# Patient Record
Sex: Male | Born: 1975 | Race: White | Hispanic: No | Marital: Married | Smoking: Current every day smoker
Health system: Southern US, Community
[De-identification: ages and names within clinical notes are randomized; demographics above are authoritative.]

## PROBLEM LIST (undated history)

## (undated) DIAGNOSIS — R29810 Facial weakness: Secondary | ICD-10-CM

## (undated) DIAGNOSIS — Z8619 Personal history of other infectious and parasitic diseases: Secondary | ICD-10-CM

## (undated) HISTORY — PX: WISDOM TOOTH EXTRACTION: SHX21

## (undated) HISTORY — DX: Personal history of other infectious and parasitic diseases: Z86.19

## (undated) HISTORY — DX: Facial weakness: R29.810

---

## 2016-05-03 DIAGNOSIS — D225 Melanocytic nevi of trunk: Secondary | ICD-10-CM | POA: Diagnosis not present

## 2016-05-03 DIAGNOSIS — B078 Other viral warts: Secondary | ICD-10-CM | POA: Diagnosis not present

## 2016-11-18 ENCOUNTER — Encounter: Payer: Self-pay | Admitting: Internal Medicine

## 2016-11-18 ENCOUNTER — Ambulatory Visit (INDEPENDENT_AMBULATORY_CARE_PROVIDER_SITE_OTHER): Payer: BLUE CROSS/BLUE SHIELD | Admitting: Internal Medicine

## 2016-11-18 VITALS — BP 144/84 | HR 68 | Temp 98.4°F | Ht 69.33 in | Wt 170.5 lb

## 2016-11-18 DIAGNOSIS — N50812 Left testicular pain: Secondary | ICD-10-CM

## 2016-11-18 DIAGNOSIS — R202 Paresthesia of skin: Secondary | ICD-10-CM

## 2016-11-18 DIAGNOSIS — R030 Elevated blood-pressure reading, without diagnosis of hypertension: Secondary | ICD-10-CM | POA: Diagnosis not present

## 2016-11-18 NOTE — Progress Notes (Signed)
HPI  Pt presents to the clinic today to establish care and for management of the conditions listed below. He has not had a PCP in many years.  HTN: His BP today is 144/84. He reports he has never been told that he has HTN. He denies headaches, dizziness or visual changes. He denies chest pain or shortness of breath.   He also reports decreased sensation in his left lateral thigh. He noticed this 1 month ago. He reports it feels almost like a tingling sensation. He denies numbness. He denies any pain in his back. He denies loss of bowel or bladder. He has not taken anything OTC for this.  He also c/o left testicular pain. This started 1 month ago. It is intermittent but seems worse when he runs. He denies redness or swelling. He denies urgency, frequency or dysuria. No certain position makes it worse. He has not tried anything OTC for this.  Flu: never Tetanus: 2015 Vision Screening: as needed Dentist: as needed  Past Medical History:  Diagnosis Date  . History of chicken pox     No current outpatient prescriptions on file.   No current facility-administered medications for this visit.     No Known Allergies  Family History  Problem Relation Age of Onset  . Stroke Father     Social History   Social History  . Marital status: Married    Spouse name: N/A  . Number of children: N/A  . Years of education: N/A   Occupational History  . Not on file.   Social History Main Topics  . Smoking status: Current Every Day Smoker    Packs/day: 1.50    Types: Cigarettes  . Smokeless tobacco: Never Used  . Alcohol use 1.2 oz/week    2 Cans of beer per week     Comment: moderate  . Drug use: No  . Sexual activity: Not on file   Other Topics Concern  . Not on file   Social History Narrative  . No narrative on file    ROS:  Constitutional: Denies fever, malaise, fatigue, headache or abrupt weight changes.  HEENT: Denies eye pain, eye redness, ear pain, ringing in the ears,  wax buildup, runny nose, nasal congestion, bloody nose, or sore throat. Respiratory: Denies difficulty breathing, shortness of breath, cough or sputum production.   Cardiovascular: Denies chest pain, chest tightness, palpitations or swelling in the hands or feet.  Gastrointestinal: Denies abdominal pain, bloating, constipation, diarrhea or blood in the stool.  GU: Pt reports testicular pain. Denies frequency, urgency, pain with urination, blood in urine, odor or discharge. Musculoskeletal: Denies decrease in range of motion, difficulty with gait, muscle pain or joint pain and swelling.  Skin: Denies redness, rashes, lesions or ulcercations.  Neurological: Pt reports tingling of his left thigh. Denies dizziness, difficulty with memory, difficulty with speech or problems with balance and coordination.  Psych: Denies anxiety, depression, SI/HI.  No other specific complaints in a complete review of systems (except as listed in HPI above).  PE:  BP (!) 144/84   Pulse 68   Temp 98.4 F (36.9 C) (Oral)   Ht 5' 9.33" (1.761 m)   Wt 170 lb 8 oz (77.3 kg)   SpO2 97%   BMI 24.94 kg/m  Wt Readings from Last 3 Encounters:  11/18/16 170 lb 8 oz (77.3 kg)    General: Appears his stated age, well developed, well nourished in NAD. Skin: Dry and intact. Cardiovascular: Normal rate and rhythm. S1,S2  noted.  No murmur, rubs or gallops noted.  Pulmonary/Chest: Normal effort and positive vesicular breath sounds. No respiratory distress. No wheezes, rales or ronchi noted.  Abdomen: Soft and nontender. Normal bowel sounds. No distention or masses noted.  Genitalia: Normal male anatomy. No pain with palpation of the left testicle. No mass noted. No redness or swelling noted. Positive cremasteric reflex. Neurological: Alert and oriented. Sensation intact to BLE. Psychiatric: He is anxious appearing today.    Assessment and Plan:  Elevated Blood Pressure:  Will have him return to the clinic in 2 weeks  to repeat blood pressure If remains elevated, will discuss antihypertensive medications  Left Testicular Pain:  No abnormality noted Advised him to try to wear briefs instead of boxers, especially while running If persist, will get urinalysis and ultrasound  Paresthesia, Left Thigh:  No concern for sciatica at this time Offered RX for Pred Taper, he declines Will monitor, he will let me know if this persist or worsens  RTC in 2 weeks for follow up of HTN Henya Aguallo, NP

## 2016-11-21 ENCOUNTER — Telehealth: Payer: Self-pay | Admitting: *Deleted

## 2016-11-21 DIAGNOSIS — N50812 Left testicular pain: Secondary | ICD-10-CM

## 2016-11-21 NOTE — Addendum Note (Signed)
Addended by: Lorre MunroeBAITY, REGINA W on: 11/21/2016 01:10 PM   Modules accepted: Orders

## 2016-11-21 NOTE — Patient Instructions (Signed)
Paresthesia  Paresthesia is a burning or prickling feeling. This feeling can happen in any part of the body. It often happens in the hands, arms, legs, or feet. Usually, it is not painful. In most cases, the feeling goes away in a short time and is not a sign of a serious problem.  Follow these instructions at home:  · Avoid drinking alcohol.  · Try massage or needle therapy (acupuncture) to help with your problems.  · Keep all follow-up visits as told by your doctor. This is important.  Contact a doctor if:  · You keep on having episodes of paresthesia.  · Your burning or prickling feeling gets worse when you walk.  · You have pain or cramps.  · You feel dizzy.  · You have a rash.  Get help right away if:  · You feel weak.  · You have trouble walking or moving.  · You have problems speaking, understanding, or seeing.  · You feel confused.  · You cannot control when you pee (urinate) or poop (bowel movement).  · You lose feeling (numbness) after an injury.  · You pass out (faint).  This information is not intended to replace advice given to you by your health care provider. Make sure you discuss any questions you have with your health care provider.  Document Released: 06/16/2008 Document Revised: 12/10/2015 Document Reviewed: 06/30/2014  Elsevier Interactive Patient Education © 2017 [employer] Inc[employer]

## 2016-11-21 NOTE — Telephone Encounter (Signed)
Pt would like the referral now and not [place]

## 2016-11-21 NOTE — Telephone Encounter (Signed)
We can do and order for urologist or an order for an ultrasound. Does he have a preference on which he would like?

## 2016-11-21 NOTE — Telephone Encounter (Signed)
Referral placed.

## 2016-11-21 NOTE — Telephone Encounter (Signed)
Spoke to pt who was seen on 5/4 to establish care. He states that he discussed his testicle pain with regina, but reports it has increased over the weekend. Pt was advised to f/u in 2wks, but is wanting to know if he can have a referral to a urologist sooner. pls advise

## 2016-11-22 ENCOUNTER — Emergency Department (HOSPITAL_COMMUNITY)
Admission: EM | Admit: 2016-11-22 | Discharge: 2016-11-22 | Disposition: A | Discharge status: Home / Self Care | Payer: BLUE CROSS/BLUE SHIELD | Attending: Emergency Medicine | Admitting: Emergency Medicine

## 2016-11-22 DIAGNOSIS — N50819 Testicular pain, unspecified: Secondary | ICD-10-CM | POA: Diagnosis not present

## 2016-11-22 DIAGNOSIS — F17210 Nicotine dependence, cigarettes, uncomplicated: Secondary | ICD-10-CM | POA: Insufficient documentation

## 2016-11-22 DIAGNOSIS — N50811 Right testicular pain: Secondary | ICD-10-CM | POA: Diagnosis not present

## 2016-11-22 DIAGNOSIS — N50812 Left testicular pain: Secondary | ICD-10-CM | POA: Diagnosis not present

## 2016-11-22 LAB — URINALYSIS, ROUTINE W REFLEX MICROSCOPIC
BILIRUBIN URINE: NEGATIVE
GLUCOSE, UA: NEGATIVE mg/dL
HGB URINE DIPSTICK: NEGATIVE
KETONES UR: NEGATIVE mg/dL
LEUKOCYTES UA: NEGATIVE
Nitrite: NEGATIVE
PROTEIN: NEGATIVE mg/dL
Specific Gravity, Urine: 1.006 (ref 1.005–1.030)
pH: 6 (ref 5.0–8.0)

## 2016-11-22 NOTE — ED Notes (Signed)
Pt made aware urine sample is needed.  

## 2016-11-22 NOTE — ED Provider Notes (Signed)
[facility]-EMERGENCY DEPT Provider Note   CSN: [id number] Arrival date & time: 11/22/16  2027     History   Chief Complaint Chief Complaint  Patient presents with  . Testicle Pain    HPI Gerald Page is a 41 y.o. male.  The history is provided by the patient.  Testicle Pain  This is a new problem. Episode onset: 2 months. The problem occurs constantly. Progression since onset: fluctuating. Pertinent negatives include no chest pain, no abdominal pain, no headaches and no shortness of breath. Nothing aggravates the symptoms. Nothing relieves the symptoms. He has tried nothing for the symptoms.   Patient reports that the pain began while on his honeymoon after having sexual intercourse with his wife. He reports that she might of pulled his testicles. Pain began the following day.  Past Medical History:  Diagnosis Date  . History of chicken pox     There are no active problems to display for this patient.   No past surgical history on file.     Home Medications    Prior to Admission medications   Not on File    Family History Family History  Problem Relation Age of Onset  . Stroke Father     Social History Social History  Substance Use Topics  . Smoking status: Current Every Day Smoker    Packs/day: 1.50    Years: 30.00    Types: Cigarettes  . Smokeless tobacco: Never Used  . Alcohol use 2.4 - 3.6 oz/week    4 - 6 Cans of beer per week     Comment: moderate     Allergies   Patient has no known allergies.   Review of Systems Review of Systems  Respiratory: Negative for shortness of breath.   Cardiovascular: Negative for chest pain.  Gastrointestinal: Negative for abdominal pain.  Genitourinary: Positive for testicular pain. Negative for discharge, flank pain, genital sores, hematuria, penile pain, penile swelling and scrotal swelling.  Neurological: Negative for headaches.     Physical Exam Updated Vital Signs BP (!) 134/99 (BP Location: Left Arm)    Pulse 64   Temp 98.6 F (37 C) (Oral)   Resp 16   SpO2 100%  ** Physical Exam  Constitutional: He is oriented to person, place, and time. He appears well-developed and well-nourished. No distress.  HENT:  Head: Normocephalic and atraumatic.  Right Ear: External ear normal.  Left Ear: External ear normal.  Nose: Nose normal.  Mouth/Throat: Mucous membranes are normal. No trismus in the jaw.  Eyes: Conjunctivae and EOM are normal. No scleral icterus.  Neck: Normal range of motion and phonation normal.  Cardiovascular: Normal rate and regular rhythm.   Pulmonary/Chest: Effort normal. No stridor. No respiratory distress.  Abdominal: He exhibits no distension. Hernia confirmed negative in the right inguinal area and confirmed negative in the left inguinal area.  Genitourinary: Testes normal and penis normal. Cremasteric reflex is present. Right testis shows no mass, no swelling and no tenderness. Right testis is descended. Left testis shows no mass, no swelling and no tenderness. Left testis is descended. Circumcised. No penile tenderness. No discharge found.  Musculoskeletal: Normal range of motion. He exhibits no edema.  Lymphadenopathy: No inguinal adenopathy noted on the right or left side.  Neurological: He is alert and oriented to person, place, and time.  Skin: He is not diaphoretic.  Psychiatric: He has a normal mood and affect. His behavior is normal.  Vitals reviewed.    ED Treatments / Results  Labs (all labs ordered are listed, but only abnormal results are displayed) Labs Reviewed  URINALYSIS, ROUTINE W REFLEX MICROSCOPIC - Abnormal; Notable for the following:       Result Value   Color, Urine STRAW (*)    All other components within normal limits    EKG  EKG Interpretation None       Radiology No results found.  Procedures Procedures (including critical care time)  Medications Ordered in ED Medications - No data to display   Initial Impression /  Assessment and Plan / ED Course  I have reviewed the triage vital signs and the nursing notes.  Pertinent labs & imaging results that were available during my care of the patient were reviewed by me and considered in my medical decision making (see chart for details).     Presentation consistent with testicular torsion. No evidence of cellulitis. UA without evidence of infection. Low suspicion for epididymitis/orchitis.  Symptoms started following sexual encounter with wife. Possible soft tissue injury.   No indication for emergent ultrasound at this time. We'll have patient follow-up with urology.  Final Clinical Impressions(s) / ED Diagnoses   Final diagnoses:  Testicle pain   Disposition: Discharge  Condition: Good  I have discussed the results, Dx and Tx plan with the patient who expressed understanding and agree(s) with the plan. Discharge instructions discussed at great length. The patient was given strict return precautions who verbalized understanding of the instructions. No further questions at time of discharge.    New Prescriptions   No medications on file    Follow Up: Sebastian Ache, MD [address] [place] [address] [phone]  Schedule an appointment as soon as possible for a visit    Lorre Munroe, NP [address] [place] [address] [phone]  Schedule an appointment as soon as possible for a visit        Nira Conn, MD 11/22/16 2209

## 2016-11-22 NOTE — ED Notes (Signed)
Pt c/o testicle pain for over 1 month. Pt has had pain since being on honeymoon and it hasn't been right since. Pt has had to place ice packs on testicle to relieve pain. Denies any urinary symptoms.

## 2016-11-23 ENCOUNTER — Telehealth: Payer: Self-pay | Admitting: Internal Medicine

## 2016-11-23 ENCOUNTER — Other Ambulatory Visit: Payer: Self-pay | Admitting: Internal Medicine

## 2016-11-23 ENCOUNTER — Ambulatory Visit
Admission: RE | Admit: 2016-11-23 | Discharge: 2016-11-23 | Disposition: A | Discharge status: Home / Self Care | Payer: BLUE CROSS/BLUE SHIELD | Source: Ambulatory Visit | Attending: Internal Medicine | Admitting: Internal Medicine

## 2016-11-23 DIAGNOSIS — N50812 Left testicular pain: Secondary | ICD-10-CM

## 2016-11-23 DIAGNOSIS — N50819 Testicular pain, unspecified: Secondary | ICD-10-CM

## 2016-11-23 DIAGNOSIS — N4342 Spermatocele of epididymis, multiple: Secondary | ICD-10-CM | POA: Diagnosis not present

## 2016-11-23 NOTE — Telephone Encounter (Signed)
Pt returned missed call 

## 2016-11-23 NOTE — Addendum Note (Signed)
Addended by: Lorre MunroeBAITY, Jaryn Hocutt W on: 11/23/2016 11:24 AM   Modules accepted: Orders

## 2016-11-23 NOTE — Telephone Encounter (Signed)
Ultrasound ordered

## 2016-11-23 NOTE — Telephone Encounter (Signed)
Patient spoke to [facility] and they told him he needed to call us and request that you order a Testicular US and Doppler. Please choose the [facility] in [place] as the location. They will not see him if he doesn't have the US first.Please order and include the doppler as well.

## 2016-11-24 ENCOUNTER — Ambulatory Visit: Payer: Self-pay

## 2016-11-25 ENCOUNTER — Ambulatory Visit: Payer: BLUE CROSS/BLUE SHIELD | Admitting: Urology

## 2016-11-25 ENCOUNTER — Encounter: Payer: Self-pay | Admitting: Urology

## 2016-11-25 VITALS — BP 135/79 | HR 76 | Ht 69.0 in | Wt 171.2 lb

## 2016-11-25 DIAGNOSIS — N50819 Testicular pain, unspecified: Secondary | ICD-10-CM | POA: Diagnosis not present

## 2016-11-25 NOTE — Progress Notes (Signed)
11/25/2016 2:03 PM   Gerald Page January 12, 1976 [id number]  Referring provider: Lorre MunroeBaity, Regina W, NP [address] [place], [place] [id number]  Chief Complaint  Patient presents with  . Testicle Pain    HPI: The patient is a 41 year old gentleman who presents today for evaluation of left testicular pain.  This is been gone on since his honeymoon which started on 10/15/2016. He has occasional sharp pain in his left testicle. He does not appear to radiate from anywhere. It is intermittent. Sometimes it causes him to double over in pain. He feels that heavy yardwork and jogging make it worse. Icing it makes it better. He has never had this problem before. He was seen in the emergency department 3 days ago where urinalysis and scrotal ultrasound were unremarkable for source of etiology.  He has no history of genitourinary surgery.  PMH: Past Medical History:  Diagnosis Date  . History of chicken pox     Surgical History: Past Surgical History:  Procedure Laterality Date  . WISDOM TOOTH EXTRACTION      Home Medications:  Allergies as of 11/25/2016   No Known Allergies     Medication List    as of 11/25/2016  2:03 PM   You have not been prescribed any medications.     Allergies: No Known Allergies  Family History: Family History  Problem Relation Age of Onset  . Stroke Father   . Prostate cancer Neg Hx   . Bladder Cancer Neg Hx   . Kidney cancer Neg Hx     Social History:  reports that he has been smoking Cigarettes.  He has a 45.00 pack-year smoking history. He has never used smokeless tobacco. He reports that he drinks about 2.4 - 3.6 oz of alcohol per week . He reports that he does not use drugs.  ROS: UROLOGY Frequent Urination?: No Hard to postpone urination?: No Burning/pain with urination?: No Get up at night to urinate?: No Leakage of urine?: No Urine stream starts and stops?: No Trouble starting stream?: No Do you have to strain to urinate?:  No Blood in urine?: No Urinary tract infection?: No Sexually transmitted disease?: No Injury to kidneys or bladder?: No Painful intercourse?: No Weak stream?: No Erection problems?: No Penile pain?: No  Gastrointestinal Nausea?: No Vomiting?: No Indigestion/heartburn?: No Diarrhea?: No Constipation?: No  Constitutional Fever: No Night sweats?: No Weight loss?: No Fatigue?: No  Skin Skin rash/lesions?: No Itching?: No  Eyes Blurred vision?: No Double vision?: No  Ears/Nose/Throat Sore throat?: No Sinus problems?: No  Hematologic/Lymphatic Swollen glands?: No Easy bruising?: No  Cardiovascular Leg swelling?: No Chest pain?: No  Respiratory Cough?: No Shortness of breath?: No  Endocrine Excessive thirst?: No  Musculoskeletal Back pain?: No Joint pain?: No  Neurological Headaches?: No Dizziness?: No  Psychologic Depression?: No Anxiety?: No  Physical Exam: BP 135/79 (BP Location: Left Arm, Patient Position: Sitting, Cuff Size: Normal)   Pulse 76   Ht 5\' 9"  (1.753 m)   Wt 171 lb 3.2 oz (77.7 kg)   BMI 25.28 kg/m   Constitutional:  Alert and oriented, No acute distress. HEENT:  AT, moist mucus membranes.  Trachea midline, no masses. Cardiovascular: No clubbing, cyanosis, or edema. Respiratory: Normal respiratory effort, no increased work of breathing. GI: Abdomen is soft, nontender, nondistended, no abdominal masses GU: No CVA tenderness. Normal phallus. Testicles descended bilaterally. No masses. Nontender to palpation. Skin: No rashes, bruises or suspicious lesions. Lymph: No cervical or inguinal adenopathy. Neurologic:  Grossly intact, no focal deficits, moving all 4 extremities. Psychiatric: Normal mood and affect.  Laboratory Data: No results found for: WBC, HGB, HCT, MCV, PLT  No results found for: CREATININE  No results found for: PSA  No results found for: TESTOSTERONE  No results found for: HGBA1C  Urinalysis     Component Value Date/Time   COLORURINE STRAW (A) 11/22/2016 2144   APPEARANCEUR CLEAR 11/22/2016 2144   LABSPEC 1.006 11/22/2016 2144   PHURINE 6.0 11/22/2016 2144   GLUCOSEU NEGATIVE 11/22/2016 2144   HGBUR NEGATIVE 11/22/2016 2144   BILIRUBINUR NEGATIVE 11/22/2016 2144   KETONESUR NEGATIVE 11/22/2016 2144   PROTEINUR NEGATIVE 11/22/2016 2144   NITRITE NEGATIVE 11/22/2016 2144   LEUKOCYTESUR NEGATIVE 11/22/2016 2144    Pertinent Imaging: Scrotal u/s reviewed above. Unremarkable.  Assessment & Plan:    1. Orchialgia I assured the patient that he has no signs or concerns for malignancy at this time. I did discuss this can be a difficult problem to treat as it is often muscular skeletal in nature. I have recommended for him to ice his scrotum twice per day. I've also recommended ibuprofen 600 mg 3 times a day on a scheduled basis. We'll try treating this conservatively. We did briefly discuss that his pain does not improve in one month, and he will return for follow-up visit. We will need to discuss physical therapy at that time.   Return in about 4 weeks (around 12/23/2016).  Hildred Laser, MD  [facility] [address] [place], [place] [address] [phone]

## 2016-12-02 ENCOUNTER — Ambulatory Visit: Payer: BLUE CROSS/BLUE SHIELD | Admitting: Internal Medicine

## 2016-12-21 ENCOUNTER — Telehealth: Payer: Self-pay | Admitting: Urology

## 2016-12-21 NOTE — Telephone Encounter (Signed)
Left pt mess to call/SW 

## 2016-12-21 NOTE — Telephone Encounter (Signed)
Pt called and stated Budzyn told him if the pain subsides, to cancel appt for tomorrow.  He's not sure if he should keep it or not.  Can you please give pt a call.  He says other doctors can't find anything wrong and don't understand why he's having pain.  He is still having pain.  [phone]

## 2016-12-22 ENCOUNTER — Ambulatory Visit: Payer: BLUE CROSS/BLUE SHIELD | Admitting: Urology

## 2016-12-22 NOTE — Telephone Encounter (Signed)
Pt returned call. Pt states pain has not changed since last appointment. It generally happens in the morning but not every day. Pt asks if he needs to keep appt. Per Dr Sherryl BartersBudzyn pt was advised that a referral can be made for pelvic floor therapy which may help as this appears to be musculoskeletal in nature. Pt states he would like to cancel appointment for today & will call back if pain gets worse or doesn't improve to get PT referral.

## 2017-02-05 ENCOUNTER — Emergency Department (HOSPITAL_COMMUNITY)
Admission: EM | Admit: 2017-02-05 | Discharge: 2017-02-05 | Disposition: A | Discharge status: Home / Self Care | Payer: BLUE CROSS/BLUE SHIELD | Attending: Emergency Medicine | Admitting: Emergency Medicine

## 2017-02-05 ENCOUNTER — Encounter (HOSPITAL_COMMUNITY): Payer: Self-pay | Admitting: Emergency Medicine

## 2017-02-05 ENCOUNTER — Emergency Department (HOSPITAL_COMMUNITY): Payer: BLUE CROSS/BLUE SHIELD

## 2017-02-05 DIAGNOSIS — R200 Anesthesia of skin: Secondary | ICD-10-CM | POA: Diagnosis not present

## 2017-02-05 DIAGNOSIS — G510 Bell's palsy: Secondary | ICD-10-CM | POA: Diagnosis not present

## 2017-02-05 DIAGNOSIS — R29810 Facial weakness: Secondary | ICD-10-CM | POA: Diagnosis not present

## 2017-02-05 DIAGNOSIS — F17210 Nicotine dependence, cigarettes, uncomplicated: Secondary | ICD-10-CM | POA: Insufficient documentation

## 2017-02-05 DIAGNOSIS — M6281 Muscle weakness (generalized): Secondary | ICD-10-CM | POA: Diagnosis not present

## 2017-02-05 LAB — BASIC METABOLIC PANEL
ANION GAP: 9 (ref 5–15)
BUN: 15 mg/dL (ref 6–20)
CALCIUM: 8.7 mg/dL — AB (ref 8.9–10.3)
CHLORIDE: 106 mmol/L (ref 101–111)
CO2: 25 mmol/L (ref 22–32)
CREATININE: 1.03 mg/dL (ref 0.61–1.24)
GFR calc non Af Amer: >60 mL/min (ref >60)
Glucose, Bld: 140 mg/dL — ABNORMAL HIGH (ref 65–99)
Potassium: 3.8 mmol/L (ref 3.5–5.1)
SODIUM: 140 mmol/L (ref 135–145)

## 2017-02-05 LAB — CBC WITH DIFFERENTIAL/PLATELET
BASOS ABS: 0 10*3/uL (ref 0.0–0.1)
BASOS PCT: 0 %
EOS ABS: 0.1 10*3/uL (ref 0.0–0.7)
Eosinophils Relative: 1 %
HEMATOCRIT: 44.9 % (ref 39.0–52.0)
HEMOGLOBIN: 16.1 g/dL (ref 13.0–17.0)
Lymphocytes Relative: 12 %
Lymphs Abs: 0.9 10*3/uL (ref 0.7–4.0)
MCH: 32.5 pg (ref 26.0–34.0)
MCHC: 35.9 g/dL (ref 30.0–36.0)
MCV: 90.7 fL (ref 78.0–100.0)
Monocytes Absolute: 0.6 10*3/uL (ref 0.1–1.0)
Monocytes Relative: 7 %
NEUTROS ABS: 6.1 10*3/uL (ref 1.7–7.7)
NEUTROS PCT: 80 %
Platelets: 214 10*3/uL (ref 150–400)
RBC: 4.95 MIL/uL (ref 4.22–5.81)
RDW: 12 % (ref 11.5–15.5)
WBC: 7.7 10*3/uL (ref 4.0–10.5)

## 2017-02-05 MED ORDER — PREDNISONE 10 MG (21) PO TBPK: ORAL_TABLET | Freq: Every day | ORAL | 0 refills | Status: AC

## 2017-02-05 MED ORDER — ACYCLOVIR 400 MG PO TABS: 400.0000 mg | ORAL_TABLET | Freq: Every day | ORAL | 0 refills | Status: AC

## 2017-02-05 NOTE — ED Triage Notes (Signed)
Pt woke up with R side facial numbness and droop this am. No extremity weakness or numbness. No vision changes. No HA.

## 2017-02-05 NOTE — ED Provider Notes (Signed)
Pt transferred for MRI. Pt has R sided facial numbness, weakness. Weakness is central sparing./ Pt has hx of heavy smoking (2 packs /day sometimes) and heavy drinking.  MRI ordered, and neg.  We will tx as Bells, but recommend a f.u with Neurologist as the symptoms are not 100% similar to Bells. Pt denies any family hx of MS.    Derwood KaplanNanavati, Alp [place], MD 02/05/17 [phone]

## 2017-02-05 NOTE — ED Notes (Signed)
Pt denies any previous issues with claustrophobia, states he does not feel that he needs any medication prior to going to MRI.

## 2017-02-05 NOTE — Discharge Instructions (Signed)
MRI shows no signs of stroke. We ask you to take the medicine prescribed, which would cover you for Bell's Palsy. However, the symptoms are not 100% consistent with Bell's so we still would like you to see the neurologist.

## 2017-02-05 NOTE — ED Notes (Signed)
ED Provider at bedside. 

## 2017-02-05 NOTE — ED Notes (Signed)
Pt arrives via [facility] from [facility] for c/o right sided facial numbness and droop that patient noticed when he woke up around 11am this morning. Pt a/ox4, speech clear, no other neuro deficits noted.

## 2017-02-05 NOTE — ED Notes (Signed)
Patient transported to CT 

## 2017-02-05 NOTE — ED Notes (Signed)
Report given to [facility] staff. 

## 2017-02-05 NOTE — ED Notes (Signed)
Patient transported to MRI 

## 2017-02-05 NOTE — ED Notes (Signed)
[facility] RN notified that pt will be transported to ED for MRI

## 2017-02-05 NOTE — ED Provider Notes (Addendum)
[facility]-EMERGENCY DEPT Provider Note   CSN: [id number] Arrival date & time: 02/05/17  1148     History   Chief Complaint Chief Complaint  Patient presents with  . facial numbness    HPI Gerald Page is a 41 y.o. male. CC: right facial "weakness"  HPI: 41 year old male who complains of right facial "weakness". He states that it just felt different tonight he could not place it. It felt normal to touch with his hand. He noticed in the mirror that his right lower face looked weaker. He is no headache or neck pain. He is a smoker. He states at times is been told his blood pressure is high at Dr. dental visits but is not on medication. No diabetes. No history of strokes. No history of A. fib or palpitations. No upper lower extremity signs or symptoms or weakness or numbness.  Past Medical History:  Diagnosis Date  . History of chicken pox     There are no active problems to display for this patient.   Past Surgical History:  Procedure Laterality Date  . WISDOM TOOTH EXTRACTION         Home Medications    Prior to Admission medications   Not on File    Family History Family History  Problem Relation Age of Onset  . Stroke Father   . Prostate cancer Neg Hx   . Bladder Cancer Neg Hx   . Kidney cancer Neg Hx     Social History Social History  Substance Use Topics  . Smoking status: Current Every Day Smoker    Packs/day: 1.50    Years: 30.00    Types: Cigarettes  . Smokeless tobacco: Never Used  . Alcohol use 2.4 - 3.6 oz/week    4 - 6 Cans of beer per week     Comment: moderate     Allergies   Patient has no known allergies.   Review of Systems Review of Systems  Constitutional: Negative for appetite change, chills, diaphoresis, fatigue and fever.  HENT: Negative for mouth sores, sore throat and trouble swallowing.   Eyes: Negative for visual disturbance.  Respiratory: Negative for cough, chest tightness, shortness of breath and wheezing.     Cardiovascular: Negative for chest pain.  Gastrointestinal: Negative for abdominal distention, abdominal pain, diarrhea, nausea and vomiting.  Endocrine: Negative for polydipsia, polyphagia and polyuria.  Genitourinary: Negative for dysuria, frequency and hematuria.  Musculoskeletal: Negative for gait problem.  Skin: Negative for color change, pallor and rash.  Neurological: Negative for dizziness, syncope, light-headedness and headaches.       Right lower facial weakness  Hematological: Does not bruise/bleed easily.  Psychiatric/Behavioral: Negative for behavioral problems and confusion.     Physical Exam Updated Vital Signs BP 128/83   Pulse 80   Resp 16   SpO2 98%   Physical Exam  Constitutional: He is oriented to person, place, and time. He appears well-developed and well-nourished. No distress.  HENT:  Head: Normocephalic.  Eyes: Pupils are equal, round, and reactive to light. Conjunctivae are normal. No scleral icterus.  Neck: Normal range of motion. Neck supple. No thyromegaly present.  Cardiovascular: Normal rate and regular rhythm.  Exam reveals no gallop and no friction rub.   No murmur heard. Pulmonary/Chest: Effort normal and breath sounds normal. No respiratory distress. He has no wheezes. He has no rales.  Abdominal: Soft. Bowel sounds are normal. He exhibits no distension. There is no tenderness. There is no rebound.  Musculoskeletal: Normal range of  motion.  Neurological: He is alert and oriented to person, place, and time.  Subtle decreased movement of the right lower face versus left. He has no Bell's phenomenon. He has normal movement of the upper face bilaterally. Normal extra ocular movements. Pupils symmetric reactive. Normal palate. Normal shoulder shrug. Patient given a sip of cola. He states it tastes the same on his anterior tongue  Skin: Skin is warm and dry. No rash noted.  Psychiatric: He has a normal mood and affect. His behavior is normal.     ED  Treatments / Results  Labs (all labs ordered are listed, but only abnormal results are displayed) Labs Reviewed  CBC WITH DIFFERENTIAL/PLATELET  BASIC METABOLIC PANEL    EKG  EKG Interpretation None       Radiology No results found.  Procedures Procedures (including critical care time)  Medications Ordered in ED Medications - No data to display   Initial Impression / Assessment and Plan / ED Course  I have reviewed the triage vital signs and the nursing notes.  Pertinent labs & imaging results that were available during my care of the patient were reviewed by me and considered in my medical decision making (see chart for details).   isolated right lower facial weakness without upper facial weakness to suggest peripheral nerve palsy. Patient's NIH is 1 for facial weakness area and not a TPA candidate. Symptoms present on awakening. Plan CT, and if  Normal, will require MRI.  Final Clinical Impressions(s) / ED Diagnoses   Final diagnoses:  Facial weakness    CT normal. On reexam I still cannot find any suggestion of upper facial weakness or Bell's phenomenon, hearing change, or tastes change. Transferred to [facility] for MRI. Normal treatment would involve prednisone and taper with neurologic follow-up for probable early Bell's palsy  New Prescriptions New Prescriptions   No medications on file     Rolland Porter, MD 02/05/17 1227    Rolland Porter, MD 02/05/17 [phone]

## 2017-02-08 ENCOUNTER — Encounter: Payer: Self-pay | Admitting: Neurology

## 2017-02-08 ENCOUNTER — Ambulatory Visit (INDEPENDENT_AMBULATORY_CARE_PROVIDER_SITE_OTHER): Payer: BLUE CROSS/BLUE SHIELD | Admitting: Neurology

## 2017-02-08 DIAGNOSIS — R29810 Facial weakness: Secondary | ICD-10-CM

## 2017-02-08 HISTORY — DX: Facial weakness: R29.810

## 2017-02-08 NOTE — Progress Notes (Signed)
Reason for visit: Right facial weakness  Referring physician: [place]  Baldo AshMichael Stegall is a 41 y.o. male  History of present illness:  Mr. Gerald Page is a 41 year old right-handed white male with a history of tobacco abuse. The patient went to the emergency room on 02/05/2017 with onset of right lower face weakness that he noted upon awakening. The patient underwent MRI of the brain that was unremarkable. The patient does not report any pain around the right ear or in the head, he denies numbness of the extremities or on the face. He has not had any change in speech or swallowing or difficulty closing the eye. He denies any drooling, he is able to chew and swallow well. He has not noted any swelling around the right jaw area. He denies any balance problems or difficulty controlling the bowels or the bladder. The patient was placed on prednisone and Valtrex, but he never took the medications. He has a long-standing history of some numbness in the anterolateral aspect of the left thigh. He is sent to this office for an evaluation.  Past Medical History:  Diagnosis Date  . History of chicken pox     Past Surgical History:  Procedure Laterality Date  . WISDOM TOOTH EXTRACTION     x2    Family History  Problem Relation Age of Onset  . Stroke Father   . Prostate cancer Neg Hx   . Bladder Cancer Neg Hx   . Kidney cancer Neg Hx     Social history:  reports that he has been smoking Cigarettes.  He has a 45.00 pack-year smoking history. He has never used smokeless tobacco. He reports that he drinks about 2.4 - 3.6 oz of alcohol per week . He reports that he does not use drugs.  Medications:  Prior to Admission medications   Medication Sig Start Date End Date Taking? Authorizing Provider  acyclovir (ZOVIRAX) 400 MG tablet Take 1 tablet (400 mg total) by mouth 5 (five) times daily. Patient not taking: Reported on 02/08/2017 02/05/17   Derwood KaplanNanavati, Ankit, MD  predniSONE (STERAPRED UNI-PAK 21 TAB)  10 MG (21) TBPK tablet Take by mouth daily. Take 6 tabs by mouth daily  for 2 days, then 5 tabs for 2 days, then 4 tabs for 2 days, then 3 tabs for 2 days, 2 tabs for 2 days, then 1 tab by mouth daily for 2 days Patient not taking: Reported on 02/08/2017 02/05/17   Derwood KaplanNanavati, Ankit, MD     No Known Allergies  ROS:  Out of a complete 14 system review of symptoms, the patient complains only of the following symptoms, and all other reviewed systems are negative.  Right facial weakness  Blood pressure (!) 146/102, pulse 79, height 5\' 9"  (1.753 m), weight 169 lb 8 oz (76.9 kg).  Physical Exam  General: The patient is alert and cooperative at the time of the examination.  Eyes: Pupils are equal, round, and reactive to light. Discs are flat bilaterally.  Ears: Tympanic membranes are clear bilaterally.  Throat: No masses or swelling is noted in the oropharyngeal area.  Neck: The neck is supple, no carotid bruits are noted. No masses or swelling are noted around the angle of the jaw on the right or around the parotid gland.  Respiratory: The respiratory examination is clear.  Cardiovascular: The cardiovascular examination reveals a regular rate and rhythm, no obvious murmurs or rubs are noted.  Skin: Extremities are without significant edema.  Neurologic Exam  Mental status: The patient is alert and oriented x 3 at the time of the examination. The patient has apparent normal recent and remote memory, with an apparently normal attention span and concentration ability.  Cranial nerves: Facial symmetry is not present. There is a mild depression of the right nasolabial fold, minimally asymmetric smile. There is good sensation of the face to pinprick and soft touch bilaterally. The strength of the facial muscles and the muscles to head turning and shoulder shrug are normal bilaterally. Speech is well enunciated, no aphasia or dysarthria is noted. Extraocular movements are full. Visual fields are  full. The tongue is midline, and the patient has symmetric elevation of the soft palate. No obvious hearing deficits are noted.  Motor: The motor testing reveals 5 over 5 strength of all 4 extremities. Good symmetric motor tone is noted throughout.  Sensory: Sensory testing is intact to pinprick, soft touch, vibration sensation, and position sense on all 4 extremities. No evidence of extinction is noted.  Coordination: Cerebellar testing reveals good finger-nose-finger and heel-to-shin bilaterally.  Gait and station: Gait is normal. Tandem gait is normal. Romberg is negative. No drift is seen.  Reflexes: Deep tendon reflexes are symmetric and normal bilaterally. Toes are downgoing bilaterally.    MRI brain 02/05/17:  IMPRESSION: Normal intracranial examination. Some mucosal inflammation of the paranasal sinuses. No cause of the presenting symptoms is Identified.  * MRI scan images were reviewed online. I agree with the written report.    Assessment/Plan:  1. Right facial weakness  The patient appears to have a lower facial weakness, no other deficits are noted. MRI of the brain did not show evidence of a small brain or brainstem stroke. The patient does have risk factors for stroke, it is possible that a small stroke could have been missed. The patient could also have a very mild form of Bell's palsy. We will check blood work today, he will hold off on taking the prednisone and Valtrex, he will call our office if any progression of weakness is noted.   Marlan Palau. Keith Willis MD 02/08/2017 11:28 AM  [facility] [address] [place], [place] [id number]  Phone [phone] Fax [phone]

## 2017-02-10 LAB — B. BURGDORFI ANTIBODIES: Lyme IgG/IgM Ab: <0.91 {ISR} (ref 0.00–0.90)

## 2017-02-10 LAB — ANGIOTENSIN CONVERTING ENZYME: Angio Convert Enzyme: 46 U/L (ref 14–82)

## 2017-02-10 LAB — SEDIMENTATION RATE: Sed Rate: 2 mm/hr (ref 0–15)

## 2017-02-10 LAB — ANA W/REFLEX: ANA: NEGATIVE

## 2017-02-13 ENCOUNTER — Telehealth: Payer: Self-pay | Admitting: *Deleted

## 2017-02-13 NOTE — Telephone Encounter (Signed)
-----   Message from York Spanielharles K Willis, MD sent at 02/11/2017  8:12 AM EDT -----   The blood work results are unremarkable. Please call the patient.  ----- Message ----- From: Nell RangeInterface, [facility] Lab Results In Sent: 02/09/2017   7:45 AM To: York Spanielharles K Willis, MD

## 2017-02-13 NOTE — Telephone Encounter (Signed)
Called and spoke with patient about lab results per CW,MD note. He verbalized understanding. I went over in detail with patient of what labs were checked. Advised him to call if he has any new or worsening sx.

## 2018-10-02 IMAGING — MR MR HEAD W/O CM
9 of 10 series · 38 of 48 positions shown · non-contrast
Comparison: CT same day

CLINICAL DATA: Right facial droop and numbness beginning this
morning.

EXAM:
MRI HEAD WITHOUT CONTRAST
TECHNIQUE: Multiplanar, multiecho pulse sequences of the brain and surrounding
structures were obtained without intravenous contrast.

[Series 3: DWI · axial · 3.0mm · 0.94mm/px · z∈[-139,+7]mm · 8 of 100 slices shown (1 of 2)]
[im 1/100]
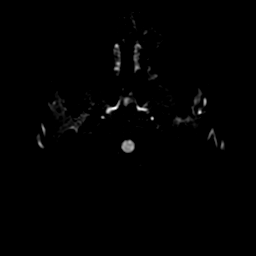
[im 15/100]
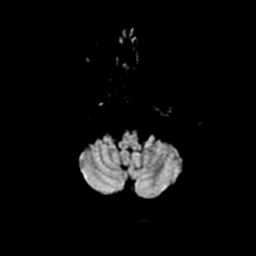
[im 29/100]
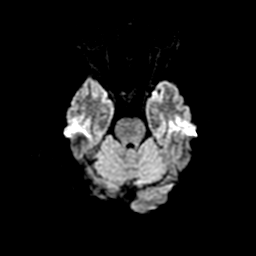
[im 43/100]
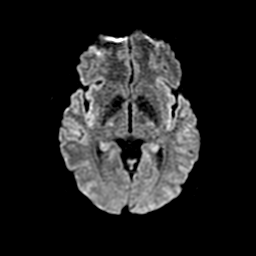
[im 57/100]
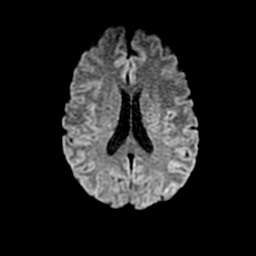
[im 71/100]
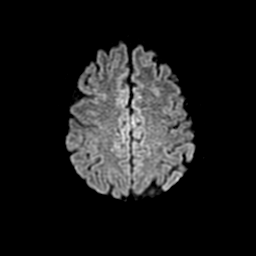
[im 85/100]
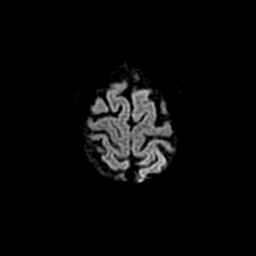
[im 100/100]
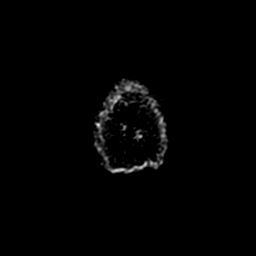

[Series 4: DWI · coronal · 4.0mm · 0.94mm/px · 7 of 76 slices shown (2 of 2)]
[im 1/76]
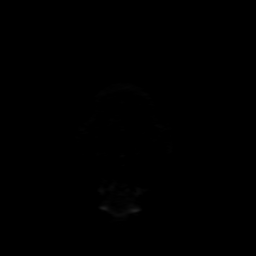
[im 13/76]
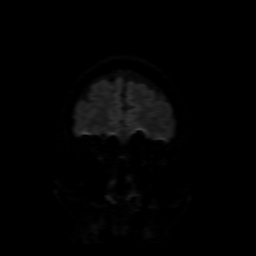
[im 26/76]
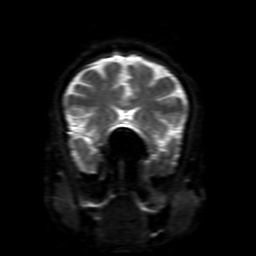
[im 38/76]
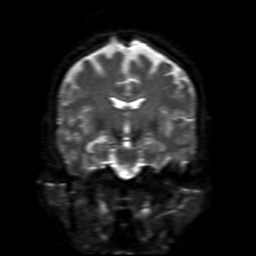
[im 51/76]
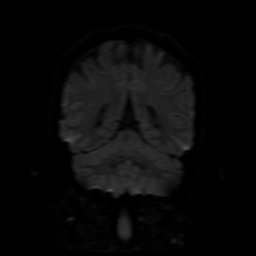
[im 63/76]
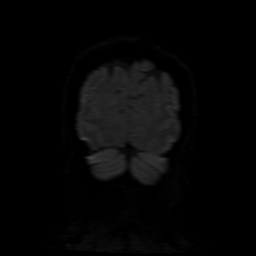
[im 76/76]
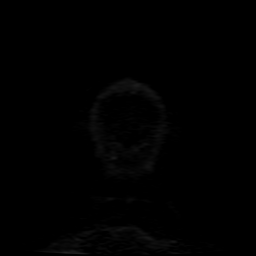

[Series 5: FLAIR · sagittal · 5.0mm · 0.47mm/px · 2 of 23 slices shown (1 of 2)]
[im 1/23]
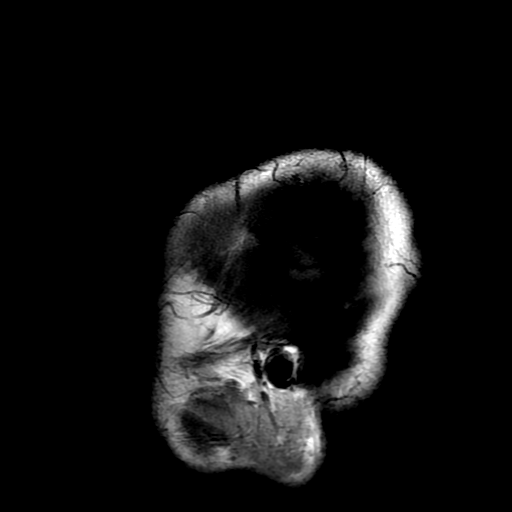
[im 23/23]
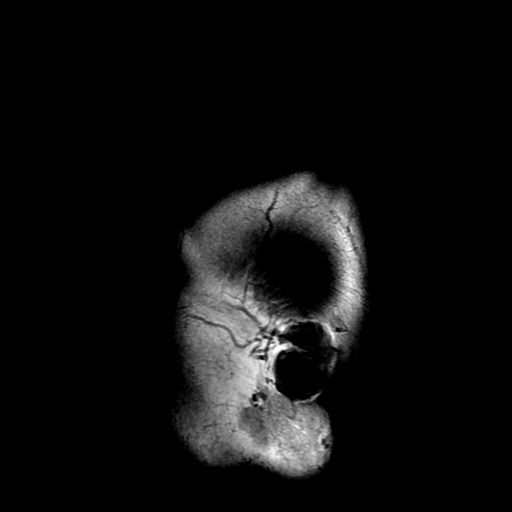

[Series 7: T2 · axial · 5.0mm · 0.47mm/px · z∈[-162,+4]mm · 3 of 29 slices shown (1 of 2)]
[im 1/29]
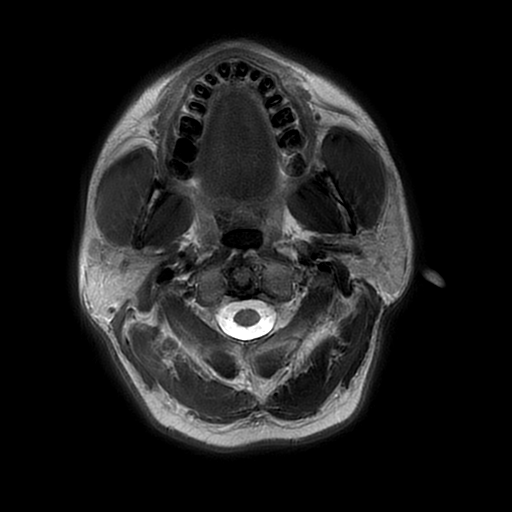
[im 15/29]
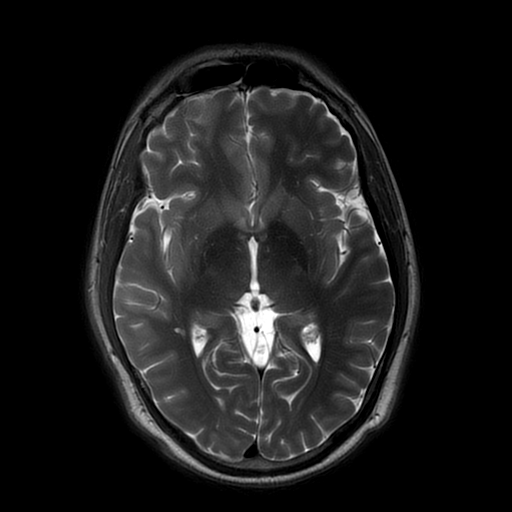
[im 29/29]
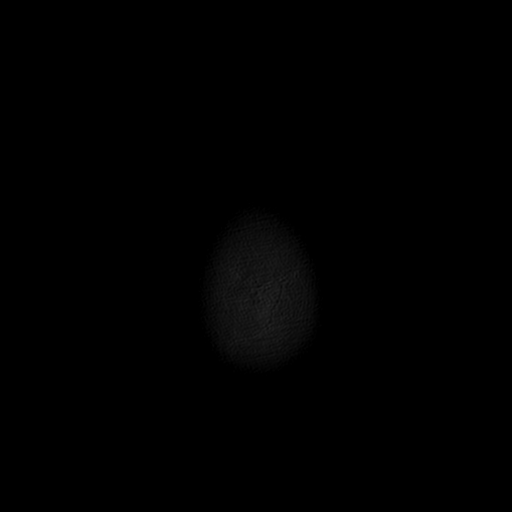

[Series 8: FLAIR · axial · 5.0mm · 0.47mm/px · z∈[-162,+4]mm · 3 of 29 slices shown (2 of 2)]
[im 1/29]
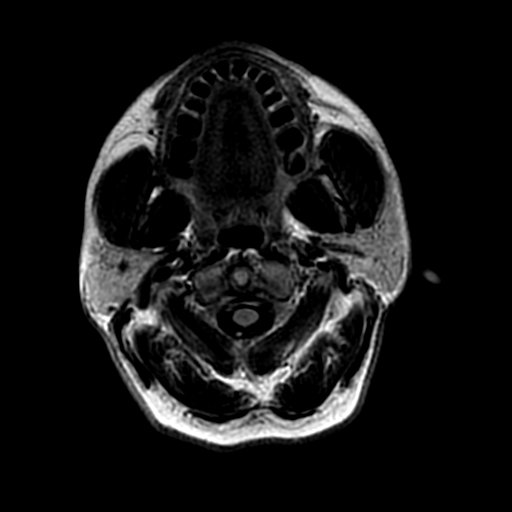
[im 15/29]
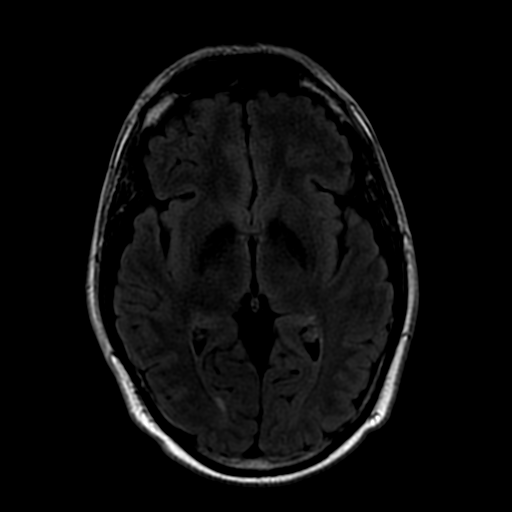
[im 29/29]
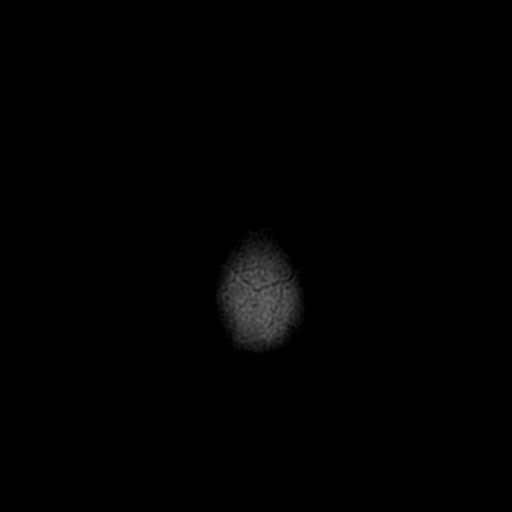

[Series 9: ax 3(person_name) · axial · 3.0mm · 0.94mm/px · z∈[-161,-4]mm · 5 of 54 slices shown]
[im 1/54]
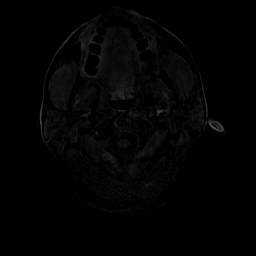
[im 14/54]
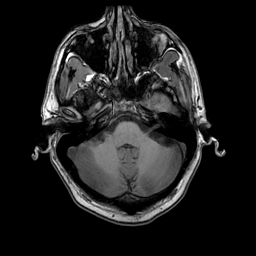
[im 27/54]
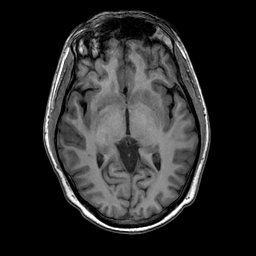
[im 40/54]
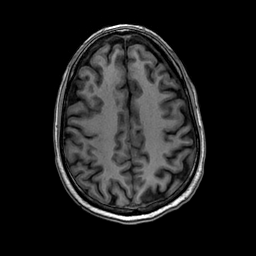
[im 54/54]
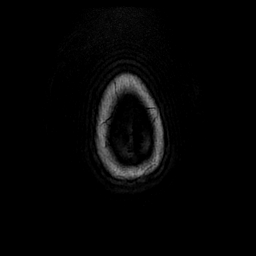

[Series 11: T2 · coronal · 5.0mm · 0.47mm/px · 3 of 29 slices shown (2 of 2)]
[im 1/29]
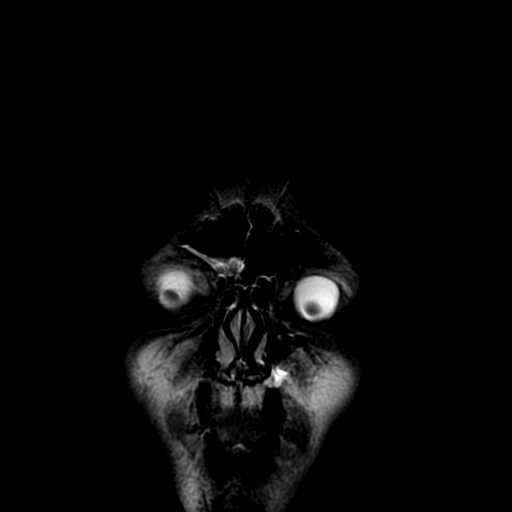
[im 15/29]
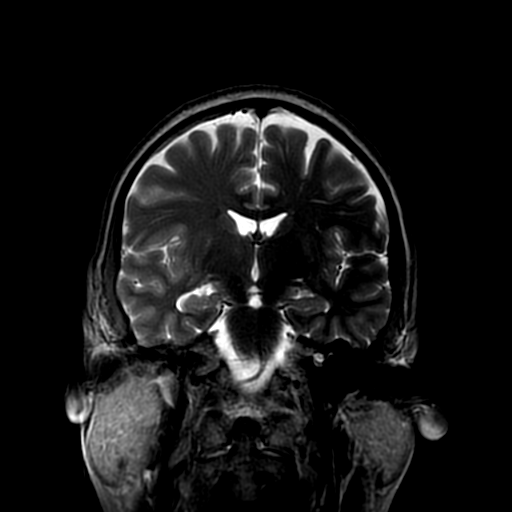
[im 29/29]
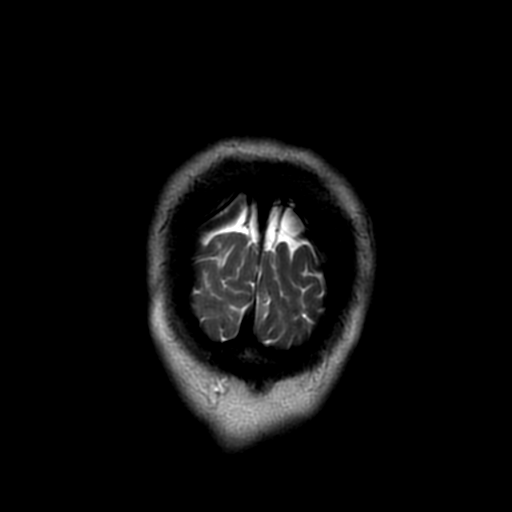

[Series 350: ADC · axial · 3.0mm · 0.94mm/px · z∈[-139,+7]mm · 4 of 50 slices shown (1 of 2)]
[im 1/50]
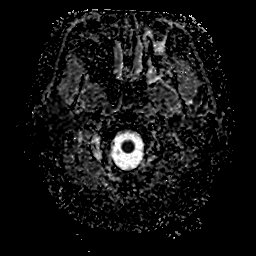
[im 17/50]
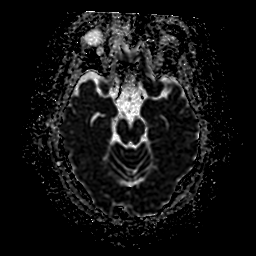
[im 33/50]
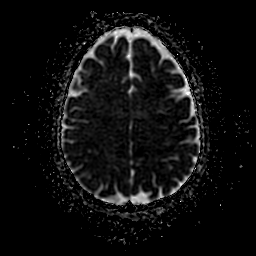
[im 50/50]
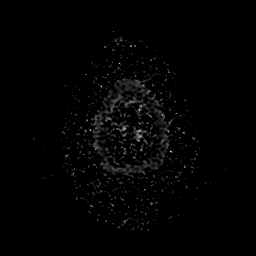

[Series 450: ADC · coronal · 4.0mm · 0.94mm/px · 3 of 38 slices shown (2 of 2)]
[im 1/38]
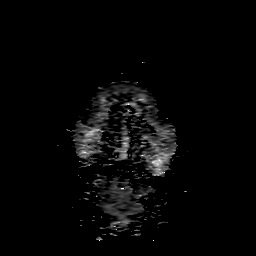
[im 19/38]
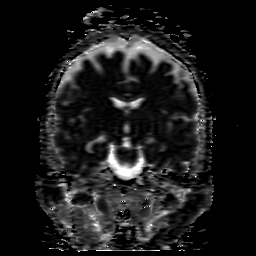
[im 38/38]
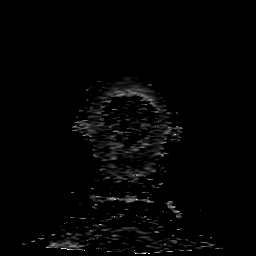

[38 of 48 positions shown; findings below may reference images not displayed]

FINDINGS: Brain: The brain has normal appearance without evidence of
malformation, atrophy, old or acute small or large vessel
infarction, hemorrhage, hydrocephalus or extra-axial collection. No
pituitary abnormality.

Vascular: Major vessels at the base of the brain show flow.

Skull and upper cervical spine: Normal

Sinuses/Orbits: There is mild mucosal thickening of the paranasal
sinuses. No layering fluid. Orbits appear negative. No fluid in the
middle ears or mastoids.

Other: None significant.
IMPRESSION: Normal intracranial examination. Some mucosal inflammation of the
paranasal sinuses. No cause of the presenting symptoms is
identified.
